# Patient Record
Sex: Male | Born: 1985 | Race: White | Hispanic: No | Marital: Single | Smoking: Never smoker
Health system: Southern US, Community
[De-identification: ages and names within clinical notes are randomized; demographics above are authoritative.]

## PROBLEM LIST (undated history)

## (undated) HISTORY — PX: NO PAST SURGERIES: SHX2092

---

## 2014-09-21 ENCOUNTER — Ambulatory Visit: Payer: Self-pay | Admitting: Family Medicine

## 2016-04-25 ENCOUNTER — Encounter: Payer: Self-pay | Admitting: Family Medicine

## 2016-04-25 ENCOUNTER — Ambulatory Visit (INDEPENDENT_AMBULATORY_CARE_PROVIDER_SITE_OTHER): Payer: BLUE CROSS/BLUE SHIELD | Admitting: Family Medicine

## 2016-04-25 VITALS — BP 120/80 | HR 82 | Temp 98.5°F | Resp 16 | Wt 148.0 lb

## 2016-04-25 DIAGNOSIS — A09 Infectious gastroenteritis and colitis, unspecified: Secondary | ICD-10-CM | POA: Insufficient documentation

## 2016-04-25 DIAGNOSIS — Z8619 Personal history of other infectious and parasitic diseases: Secondary | ICD-10-CM | POA: Insufficient documentation

## 2016-04-25 DIAGNOSIS — R109 Unspecified abdominal pain: Secondary | ICD-10-CM | POA: Insufficient documentation

## 2016-04-25 DIAGNOSIS — H9190 Unspecified hearing loss, unspecified ear: Secondary | ICD-10-CM | POA: Insufficient documentation

## 2016-04-25 DIAGNOSIS — Z789 Other specified health status: Secondary | ICD-10-CM | POA: Insufficient documentation

## 2016-04-25 DIAGNOSIS — M25539 Pain in unspecified wrist: Secondary | ICD-10-CM | POA: Insufficient documentation

## 2016-04-25 MED ORDER — CIPROFLOXACIN HCL 500 MG PO TABS
500.0000 mg | ORAL_TABLET | Freq: Two times a day (BID) | ORAL | Status: DC
Start: 2016-04-25 — End: 2016-10-25

## 2016-04-25 NOTE — Progress Notes (Signed)
       Patient: Marc Gomez Male    DOB: 06/02/1986   30 y.o.   MRN: [id number] Visit Date: 04/25/2016  Today's Provider: Megan Mansichard Gilbert Jr, MD   Chief Complaint  Patient presents with  . Diarrhea   Subjective:    Diarrhea  This is a new problem. The current episode started in the past 7 days (1 week ago). The problem occurs 5 to 10 times per day (4-5 times a day). The stool consistency is described as watery. The patient states that diarrhea does not awaken him from sleep. Associated symptoms include abdominal pain and increased flatus. Pertinent negatives include no arthralgias, bloating, chills, coughing, fever, headaches, myalgias, sweats, URI, vomiting or weight loss. Nothing aggravates the symptoms. Risk factors: just got back from [place] 1 week ago. He has tried nothing for the symptoms. There is no history of bowel resection, inflammatory bowel disease, irritable bowel syndrome, malabsorption, a recent abdominal surgery or short gut syndrome.     Patient just got back from a trip from [place] 1 week ago. He was there for 9 days. Diarrhea started the day he returned home. Bowel movements occur 4-5 times a day. Diarrhea was worse the first few days but has improved some since but not better. Does have some symptoms of abdominal pain. No fever.   No Known Allergies No outpatient prescriptions have been marked as taking for the 04/25/16 encounter (Office Visit) with Maple Hudsonichard L Gilbert Jr., MD.    Review of Systems  Constitutional: Negative for fever, chills, weight loss and appetite change.  Respiratory: Negative for cough, chest tightness, shortness of breath and wheezing.   Cardiovascular: Negative for chest pain and palpitations.  Gastrointestinal: Positive for abdominal pain, diarrhea and flatus. Negative for nausea, vomiting and bloating.  Musculoskeletal: Negative for myalgias and arthralgias.  Neurological: Negative for headaches.    Social History  Substance Use  Topics  . Smoking status: Never Smoker   . Smokeless tobacco: Not on file  . Alcohol Use: 0.0 oz/week    0 Standard drinks or equivalent per week   Objective:   BP 120/80 mmHg  Pulse 82  Temp(Src) 98.5 F (36.9 C) (Oral)  Resp 16  Wt 148 lb (67.132 kg)  SpO2 99%  Physical Exam  Constitutional: He is oriented to person, place, and time. He appears well-developed and well-nourished.  HENT:  Head: Normocephalic and atraumatic.  Eyes: Conjunctivae are normal. No scleral icterus.  Neck: Neck supple.  Cardiovascular: Normal rate, regular rhythm and normal heart sounds.   Pulmonary/Chest: Effort normal and breath sounds normal.  Abdominal: Soft. There is no tenderness.  Neurological: He is alert and oriented to person, place, and time.  Skin: Skin is warm and dry.  Psychiatric: He has a normal mood and affect. His behavior is normal. Judgment and thought content normal.        Assessment & Plan:     Traveler's Diarrhea: Ciprofloxacin 500 mg BID #14      I have done the exam and reviewed the above chart and it is accurate to the best of my knowledge.  Richard Wendelyn BreslowGilbert Jr, MD  [facility]

## 2016-04-25 NOTE — Patient Instructions (Signed)
Traveler's Diarrhea: Ciprofloxacin 500 mg BID #14

## 2016-09-27 ENCOUNTER — Ambulatory Visit: Payer: BLUE CROSS/BLUE SHIELD | Admitting: Family Medicine

## 2016-10-15 ENCOUNTER — Ambulatory Visit: Payer: Self-pay | Admitting: Family Medicine

## 2016-10-24 ENCOUNTER — Ambulatory Visit: Payer: BLUE CROSS/BLUE SHIELD | Admitting: Physician Assistant

## 2016-10-24 ENCOUNTER — Telehealth: Payer: Self-pay

## 2016-10-24 NOTE — Telephone Encounter (Signed)
Per ana patient called back and canceled appointment, pt reports he vomited and is feeling better now and is able to swallow. sd

## 2016-10-24 NOTE — Telephone Encounter (Signed)
Can he drink liquids at all? Is he tolerating his on spit? If not, or if he develops breathing difficulties he needs to go to ER.   I will also send message to [place] since she is who is seeing him.

## 2016-10-24 NOTE — Telephone Encounter (Signed)
Patient reports he is having a hard time swallowing after taking 6 capsules of Juice Plus. Patient reports he has been taking these for several years and has never had this problem. Patient denies shortness of breath, chest pain. Patient has an appointment scheduled at 3:30 today. Please advise. Thank you. sd

## 2016-10-24 NOTE — Telephone Encounter (Signed)
ok 

## 2016-10-25 ENCOUNTER — Encounter: Payer: Self-pay | Admitting: Family Medicine

## 2016-10-25 ENCOUNTER — Ambulatory Visit
Admission: RE | Admit: 2016-10-25 | Discharge: 2016-10-25 | Disposition: A | Discharge status: Home / Self Care | Payer: BLUE CROSS/BLUE SHIELD | Source: Ambulatory Visit | Attending: Family Medicine | Admitting: Family Medicine

## 2016-10-25 ENCOUNTER — Ambulatory Visit (INDEPENDENT_AMBULATORY_CARE_PROVIDER_SITE_OTHER): Payer: BLUE CROSS/BLUE SHIELD | Admitting: Family Medicine

## 2016-10-25 ENCOUNTER — Telehealth: Payer: Self-pay

## 2016-10-25 VITALS — BP 118/60 | HR 70 | Temp 98.2°F | Resp 16 | Wt 151.2 lb

## 2016-10-25 DIAGNOSIS — M25572 Pain in left ankle and joints of left foot: Secondary | ICD-10-CM | POA: Diagnosis not present

## 2016-10-25 DIAGNOSIS — M7989 Other specified soft tissue disorders: Secondary | ICD-10-CM | POA: Diagnosis not present

## 2016-10-25 NOTE — Telephone Encounter (Signed)
Patient advised as below. Patient reports he prefers to wait on the podiatry referral. sd

## 2016-10-25 NOTE — Progress Notes (Signed)
Subjective:     Patient ID: Marc Gomez, male   DOB: 05/05/1986, 30 y.o.   MRN: [id number]  HPI  Chief Complaint  Patient presents with  . Foot Pain    Patient comes in office today with complaints of left foot pain after injury on 09/06/16. Patient states that he was hiking when he jumped and landed on rock, patient states the following day after injury he had swelling and discoloration around foot.   Reports residual ankle pain on the medial side when turning his foot a certain way or with w.b.   Review of Systems     Objective:   Physical Exam  Constitutional: He appears well-developed and well-nourished. No distress.  Cardiovascular:  Pulses:      Dorsalis pedis pulses are 2+ on the left side.       Posterior tibial pulses are 2+ on the left side.  Musculoskeletal:  Localizes tenderness to his medial ankle proximal to his malleolus. Ankle ligaments stable with no increased pain on testing. DF/PF 5/5.       Assessment:    1. Acute left ankle pain - DG Ankle Complete Left; Future    Plan:    Further f/u pending x-ray report. Consider podiatry referral.

## 2016-10-25 NOTE — Patient Instructions (Signed)
We will call you with the x-ray report 

## 2016-10-25 NOTE — Telephone Encounter (Signed)
-----   Message from Anola Gurneyobert Chauvin, [place] sent at 10/25/2016  4:57 PM EST ----- No fractures or other bony abnormality. Let me know if you wish podiatry referral.

## 2018-01-22 IMAGING — CR DG ANKLE COMPLETE 3+V*L*
1 series · 3 of 3 positions shown · non-contrast
Comparison: None.

CLINICAL DATA: Pt states he was hiking 6 weeks ago and jumped [place] [address]
[place] twisting his left ankle. Pain and swelling medial left ankle.
Painful to bear weight.

EXAM:
LEFT ANKLE COMPLETE - 3+ VIEW

[Series 1: dg ankle complete left · 0.14mm/px · 3 of 3 slices shown]
[im 1/3]
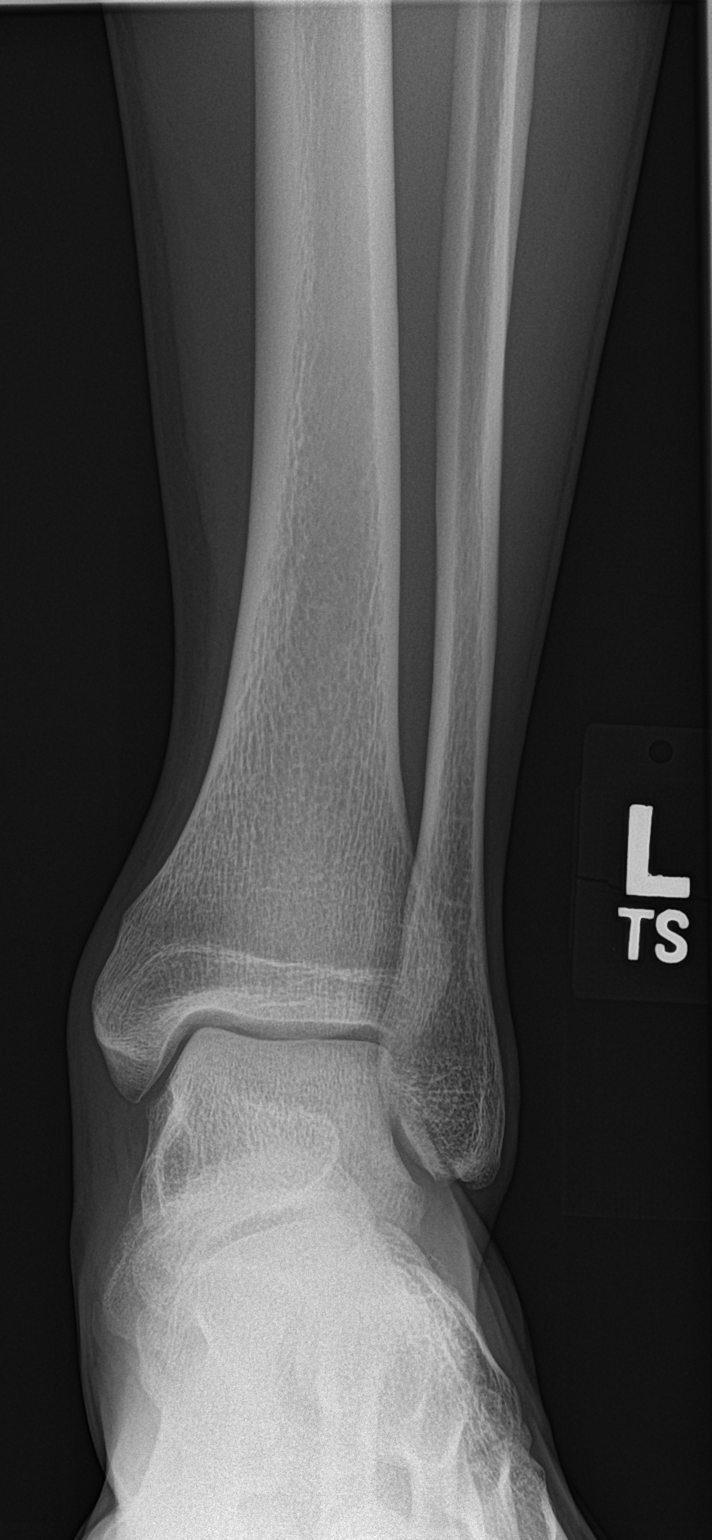
[im 2/3]
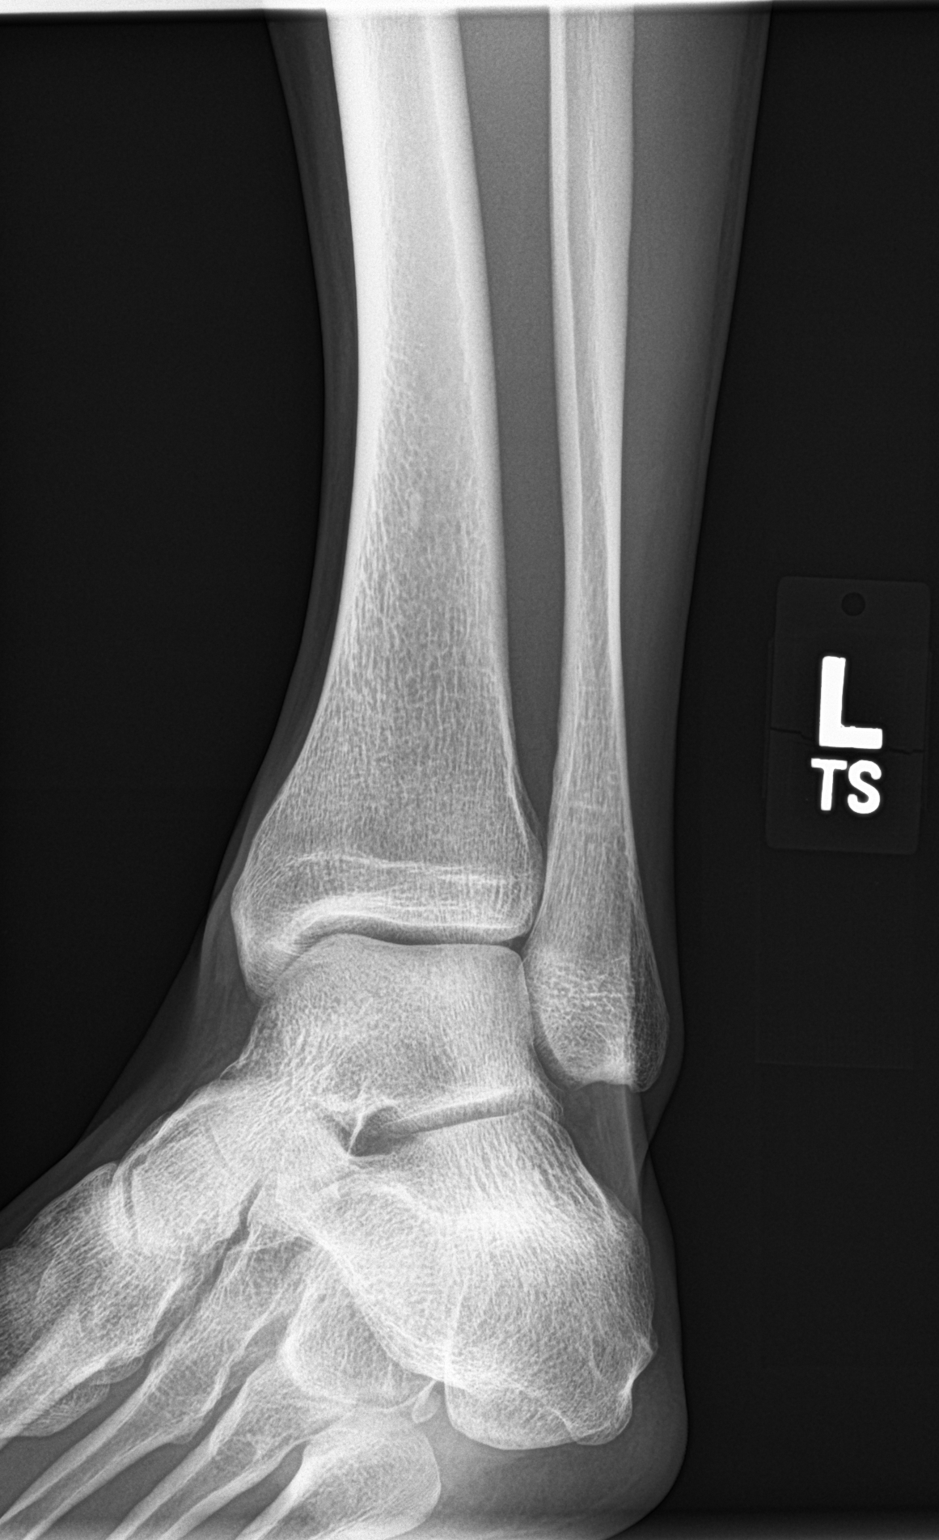
[im 3/3]
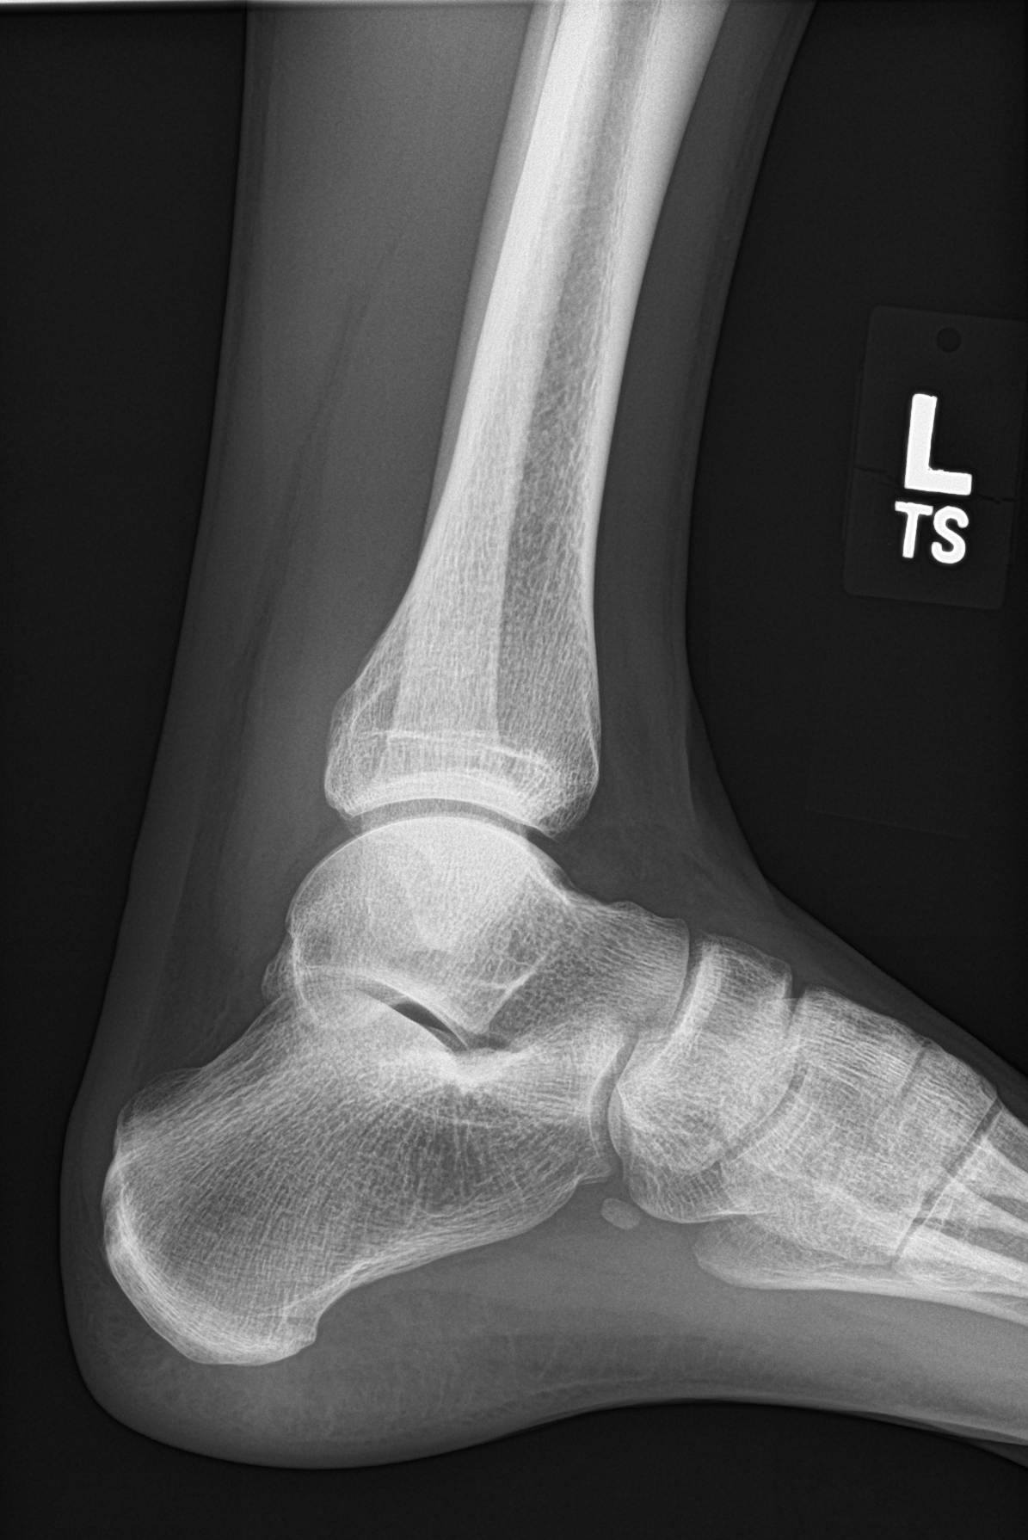

[3 of 3 positions shown; findings below may reference images not displayed]

FINDINGS: There is no evidence of fracture, dislocation, or joint effusion.
There is no evidence of arthropathy or other focal bone abnormality.
Soft tissues are unremarkable.
IMPRESSION: Negative.

## 2018-03-03 ENCOUNTER — Encounter: Payer: Self-pay | Admitting: Family Medicine

## 2018-03-03 ENCOUNTER — Ambulatory Visit (INDEPENDENT_AMBULATORY_CARE_PROVIDER_SITE_OTHER): Payer: BLUE CROSS/BLUE SHIELD | Admitting: Family Medicine

## 2018-03-03 VITALS — BP 110/80 | HR 66 | Temp 98.6°F | Resp 16 | Wt 153.0 lb

## 2018-03-03 DIAGNOSIS — R1010 Upper abdominal pain, unspecified: Secondary | ICD-10-CM

## 2018-03-03 NOTE — Patient Instructions (Signed)
We will call you with the lab results and the scheduling of the ultrasound.

## 2018-03-03 NOTE — Progress Notes (Signed)
  Subjective:     Patient ID: Marc Gomez, male   DOB: [birth year], 32 y.o.   MRN: [id number] Chief Complaint  Patient presents with  . Abdominal Pain    Patient comes in office today with complaints of intermittent upper abdominal pain for one month. Patient describes pain as pressure, he states he has relief when bending over and is usually aggravated by sitting up and eating. Patient denies G.I upset, fever or symptoms of acid reflux.    HPI States it has bothered him significantly three times over the last year. Reports both cramping and pressure which can seem to worsen after a meal. No change in bowel pattern-2 x day- or unusual stress. Rarely uses an antacid.  Review of Systems     Objective:   Physical Exam  Constitutional: He appears well-developed and well-nourished. No distress.  Abdominal: Bowel sounds are normal. There is no tenderness. There is no guarding.  .     Assessment:    1. Pain of upper abdomen - Comprehensive metabolic panel - CBC with Differential/Platelet - Lipase - H. pylori breath test - US Abdomen Complete; Future    Plan:    Further f/u pending lab and ultrasound results.

## 2018-03-04 ENCOUNTER — Telehealth: Payer: Self-pay

## 2018-03-04 LAB — COMPREHENSIVE METABOLIC PANEL
ALT: 37 IU/L (ref 0–44)
AST: 32 IU/L (ref 0–40)
Albumin/Globulin Ratio: 2.1 (ref 1.2–2.2)
Albumin: 4.7 g/dL (ref 3.5–5.5)
Alkaline Phosphatase: 80 IU/L (ref 39–117)
BUN/Creatinine Ratio: 13 (ref 9–20)
BUN: 12 mg/dL (ref 6–20)
Bilirubin Total: 0.5 mg/dL (ref 0.0–1.2)
CO2: 26 mmol/L (ref 20–29)
Calcium: 9.8 mg/dL (ref 8.7–10.2)
Chloride: 100 mmol/L (ref 96–106)
Creatinine, Ser: 0.93 mg/dL (ref 0.76–1.27)
GFR calc Af Amer: 126 mL/min/{1.73_m2} (ref >59)
GFR calc non Af Amer: 109 mL/min/{1.73_m2} (ref >59)
Globulin, Total: 2.2 g/dL (ref 1.5–4.5)
Glucose: 87 mg/dL (ref 65–99)
Potassium: 4.7 mmol/L (ref 3.5–5.2)
Sodium: 140 mmol/L (ref 134–144)
Total Protein: 6.9 g/dL (ref 6.0–8.5)

## 2018-03-04 LAB — CBC WITH DIFFERENTIAL/PLATELET
Basophils Absolute: 0 10*3/uL (ref 0.0–0.2)
Basos: 0 %
EOS (ABSOLUTE): 0.4 10*3/uL (ref 0.0–0.4)
Eos: 8 %
Hematocrit: 46.5 % (ref 37.5–51.0)
Hemoglobin: 15.8 g/dL (ref 13.0–17.7)
Immature Grans (Abs): 0 10*3/uL (ref 0.0–0.1)
Immature Granulocytes: 0 %
Lymphocytes Absolute: 1.3 10*3/uL (ref 0.7–3.1)
Lymphs: 26 %
MCH: 30.3 pg (ref 26.6–33.0)
MCHC: 34 g/dL (ref 31.5–35.7)
MCV: 89 fL (ref 79–97)
Monocytes Absolute: 0.5 10*3/uL (ref 0.1–0.9)
Monocytes: 9 %
Neutrophils Absolute: 2.9 10*3/uL (ref 1.4–7.0)
Neutrophils: 57 %
Platelets: 302 10*3/uL (ref 150–379)
RBC: 5.22 x10E6/uL (ref 4.14–5.80)
RDW: 13 % (ref 12.3–15.4)
WBC: 5.1 10*3/uL (ref 3.4–10.8)

## 2018-03-04 LAB — H. PYLORI BREATH TEST: H pylori Breath Test: NEGATIVE

## 2018-03-04 LAB — LIPASE: Lipase: 19 U/L (ref 13–78)

## 2018-03-04 NOTE — Telephone Encounter (Signed)
-----   Message from Anola Gurney, [place] sent at 03/04/2018  7:26 AM EDT ----- Labs are normal pending breath test and ultrasound results.

## 2018-03-04 NOTE — Telephone Encounter (Signed)
Patient has been advised. KW 

## 2018-03-05 ENCOUNTER — Telehealth: Payer: Self-pay

## 2018-03-05 NOTE — Telephone Encounter (Signed)
-----   Message from Anola Gurney, [place] sent at 03/04/2018  4:58 PM EDT ----- Breath test negative for ulcers.

## 2018-03-05 NOTE — Telephone Encounter (Signed)
Patient advised.KW 

## 2018-03-07 ENCOUNTER — Ambulatory Visit
Admission: RE | Admit: 2018-03-07 | Discharge: 2018-03-07 | Disposition: A | Discharge status: Home / Self Care | Payer: BLUE CROSS/BLUE SHIELD | Source: Ambulatory Visit | Attending: Family Medicine | Admitting: Family Medicine

## 2018-03-07 DIAGNOSIS — R1013 Epigastric pain: Secondary | ICD-10-CM | POA: Diagnosis not present

## 2018-03-07 DIAGNOSIS — R1011 Right upper quadrant pain: Secondary | ICD-10-CM | POA: Diagnosis not present

## 2018-03-07 DIAGNOSIS — R1010 Upper abdominal pain, unspecified: Secondary | ICD-10-CM

## 2018-03-10 ENCOUNTER — Telehealth: Payer: Self-pay

## 2018-03-10 NOTE — Telephone Encounter (Signed)
Patient advised.KW 

## 2018-03-10 NOTE — Telephone Encounter (Signed)
-----   Message from Anola Gurney, [place] sent at 03/10/2018  7:37 AM EDT ----- Ultrasound is normal. Try over the counter Prevacid 15 mg-two pills daily for two weeks. Call if not improving for referral.

## 2018-10-16 ENCOUNTER — Other Ambulatory Visit: Payer: Self-pay

## 2018-10-16 ENCOUNTER — Ambulatory Visit (INDEPENDENT_AMBULATORY_CARE_PROVIDER_SITE_OTHER): Payer: BLUE CROSS/BLUE SHIELD | Admitting: Family Medicine

## 2018-10-16 ENCOUNTER — Encounter: Payer: Self-pay | Admitting: Family Medicine

## 2018-10-16 VITALS — BP 130/80 | HR 60 | Temp 97.8°F | Ht 71.0 in | Wt 156.0 lb

## 2018-10-16 DIAGNOSIS — J209 Acute bronchitis, unspecified: Secondary | ICD-10-CM

## 2018-10-16 MED ORDER — AZITHROMYCIN 250 MG PO TABS: ORAL_TABLET | ORAL | 0 refills | Status: DC

## 2018-10-16 NOTE — Progress Notes (Signed)
  Subjective:     Patient ID: Marc Gomez, male   DOB: 11/08/1985, 32 y.o.   MRN: [id number] Chief Complaint  Patient presents with  . chest congestion    since 10/09/18 some cough and has got worse and can hear it rattle   HPI Reports initial scratchy throat and hoarseness but no sinus congestion. Reports cough is worsening with occasional production of purulent sputum and poor exercise tolerance. States he has had pneumonia a few years ago.  Review of Systems     Objective:   Physical Exam Constitutional:      General: He is not in acute distress.    Appearance: He is not ill-appearing.  Neurological:     Mental Status: He is alert.   Ears: T.M's intact without inflammation Throat: no tonsillar enlargement or exudate Neck: no cervical adenopathy Lungs: left basilar crackles and coarse expiratory sounds     Assessment:    1. Acute bronchitis, unspecified organism - azithromycin (ZITHROMAX Z-PAK) 250 MG tablet; Take 2 pills the first day then one pill daily  Dispense: 6 each; Refill: 0    Plan:    Discussed use of Mucinex and Delsym (samples provided).

## 2018-10-16 NOTE — Patient Instructions (Signed)
Discussed use of Delsym for cough and Mucinex for an expectorant.

## 2018-11-17 ENCOUNTER — Other Ambulatory Visit: Payer: Self-pay

## 2018-11-17 ENCOUNTER — Ambulatory Visit (INDEPENDENT_AMBULATORY_CARE_PROVIDER_SITE_OTHER): Payer: BLUE CROSS/BLUE SHIELD | Admitting: Family Medicine

## 2018-11-17 ENCOUNTER — Encounter: Payer: Self-pay | Admitting: Family Medicine

## 2018-11-17 VITALS — BP 136/83 | HR 74 | Temp 98.1°F | Ht 71.0 in | Wt 150.8 lb

## 2018-11-17 DIAGNOSIS — J069 Acute upper respiratory infection, unspecified: Secondary | ICD-10-CM | POA: Diagnosis not present

## 2018-11-17 DIAGNOSIS — J4520 Mild intermittent asthma, uncomplicated: Secondary | ICD-10-CM

## 2018-11-17 MED ORDER — ALBUTEROL SULFATE HFA 108 (90 BASE) MCG/ACT IN AERS: 2.0000 | INHALATION_SPRAY | Freq: Four times a day (QID) | RESPIRATORY_TRACT | 2 refills | Status: DC | PRN

## 2018-11-17 NOTE — Progress Notes (Signed)
  Subjective:     Patient ID: Marc Gomez, male   DOB: 06-04-1986, 33 y.o.   MRN: [id number] Chief Complaint  Patient presents with  . Fever    friday 11/14/18 was 101  . chest congestion    non productive, tightness, some wheezing   HPI States he is using Mucinex DM and a family member's Albuterol inhaler with improvement. Reports mild sore throat, fever to 101, no sinus congestion and non-productive cough. No childhood asthma. Review of Systems     Objective:   Physical Exam Constitutional:      General: He is not in acute distress.    Appearance: He is not ill-appearing.  Neurological:     Mental Status: He is alert.   Ears: T.M's intact without inflammation Sinuses: non-tender Throat: no tonsillar enlargement or exudate Neck: no cervical adenopathy Lungs: clear     Assessment:    1. URI, acute  2. Mild intermittent reactive airway disease without complication: rx for albuterol    Plan:    Continue Mucinex DM and albuterol. Call if not improving over the course of the week.

## 2018-11-17 NOTE — Patient Instructions (Signed)
Continue Mucinex DM and schedule albuterol inhaler at least twice daily while ill.

## 2018-11-28 ENCOUNTER — Other Ambulatory Visit: Payer: Self-pay | Admitting: Family Medicine

## 2018-11-28 ENCOUNTER — Telehealth: Payer: Self-pay

## 2018-11-28 MED ORDER — OSELTAMIVIR PHOSPHATE 75 MG PO CAPS
75.0000 mg | ORAL_CAPSULE | Freq: Two times a day (BID) | ORAL | 0 refills | Status: DC
Start: 2018-11-28 — End: 2022-04-20

## 2018-11-28 NOTE — Telephone Encounter (Signed)
Patient called office wanting to let physician know since last visit he has ran a fever on/off, he states that today his temperature was 101.2 and reports that cough has not improved since he was seen. Patient wanted to know if something can be called in or does he need an appointment?KW

## 2018-11-28 NOTE — Telephone Encounter (Signed)
States he has residual dry cough left over from o.v. 1/20 for URI.Marc Gomez He was feeling ok until the last 24 hours when he developed fever to 101.2 and aches. 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 Will send in Tamiflu and have him phone f/u on 2/3 if new sx.

## 2018-12-02 ENCOUNTER — Telehealth: Payer: Self-pay | Admitting: Family Medicine

## 2018-12-02 NOTE — Telephone Encounter (Signed)
Please review

## 2018-12-02 NOTE — Telephone Encounter (Signed)
Pt called saying he has had a lingering cough and was told to call back if not better  Call back  [phone]  Thanks teri

## 2018-12-02 NOTE — Telephone Encounter (Signed)
States fever is gone, cough is occasionally productive with mild wheezing. He is using albuterol and Mucinex DM. Will stay the course with phone f/u in two days.

## 2018-12-04 NOTE — Telephone Encounter (Signed)
Pt called back to say he has no fever but he states his cough is worse  Please advise  CB# [phone]  Thanks Barth Kirks

## 2018-12-05 ENCOUNTER — Other Ambulatory Visit: Payer: Self-pay | Admitting: Family Medicine

## 2018-12-05 DIAGNOSIS — J209 Acute bronchitis, unspecified: Secondary | ICD-10-CM

## 2018-12-05 MED ORDER — AZITHROMYCIN 250 MG PO TABS
ORAL_TABLET | ORAL | 0 refills | Status: DC
Start: 2018-12-05 — End: 2022-04-20

## 2018-12-05 MED ORDER — PREDNISONE 20 MG PO TABS: ORAL_TABLET | ORAL | 0 refills | Status: DC

## 2018-12-05 NOTE — Telephone Encounter (Signed)
Please review

## 2018-12-05 NOTE — Telephone Encounter (Signed)
Pt returning a missed call. Please call pt back to discuss.  Thanks, [employer]

## 2018-12-05 NOTE — Telephone Encounter (Signed)
Reports increased wheezing, 3 x day albuterol use, and occasional production of purulent sputum. Will treat for post-flu bronchitis with Zithromax and prednisone x 5 days.

## 2020-03-30 IMAGING — US US ABDOMEN COMPLETE
1 series · 14 of 25 positions shown · non-contrast
Comparison: None.

CLINICAL DATA: Recurrent right upper quadrant and epigastric pain
for 1 month.

EXAM:
ABDOMEN ULTRASOUND COMPLETE

[Series 1: us abdomen complete · 0.19mm/px · 14 of 82 slices shown]
[im 1/82]
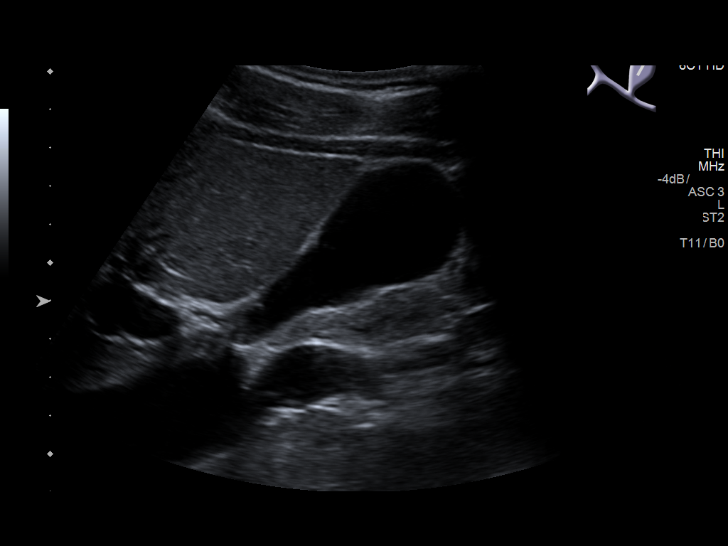
[im 7/82]
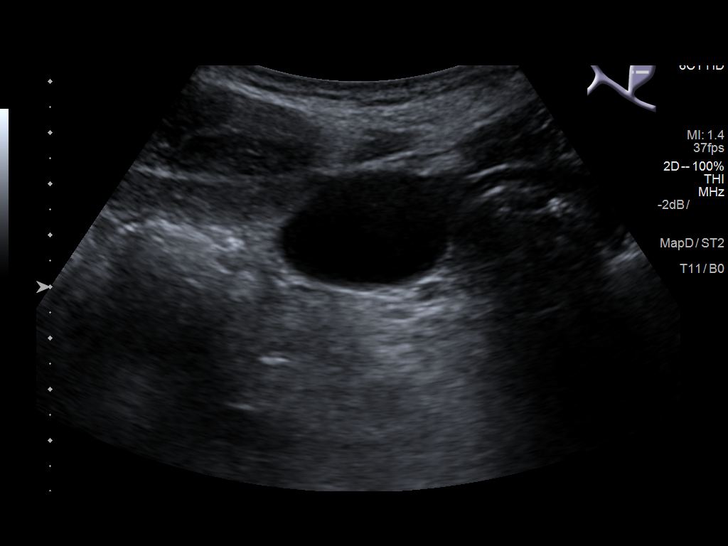
[im 14/82]
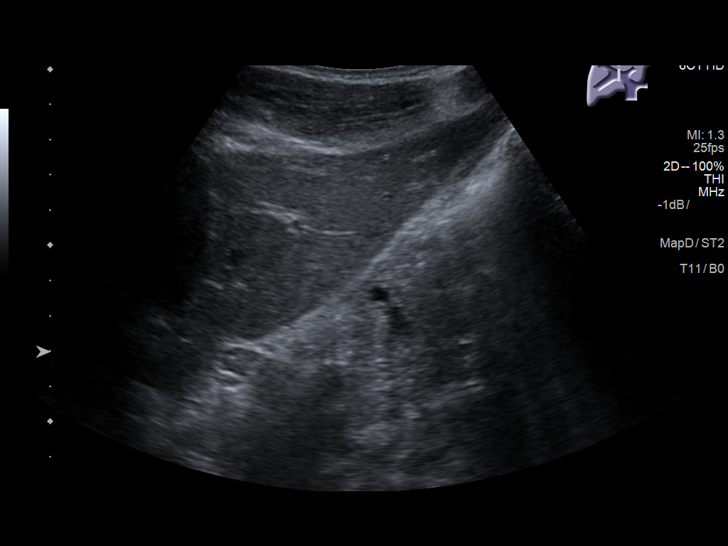
[im 21/82]
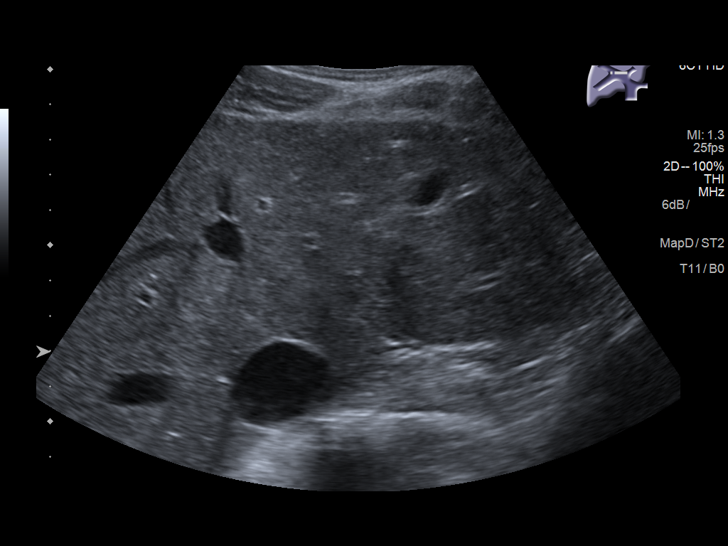
[im 28/82]
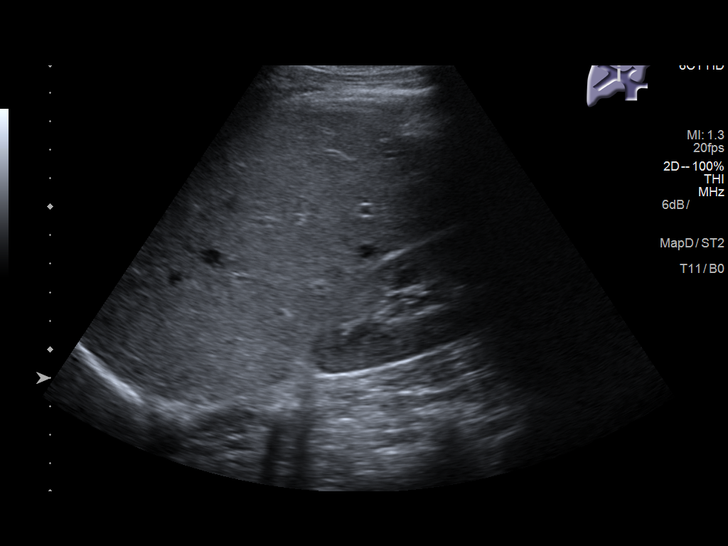
[im 31/82]
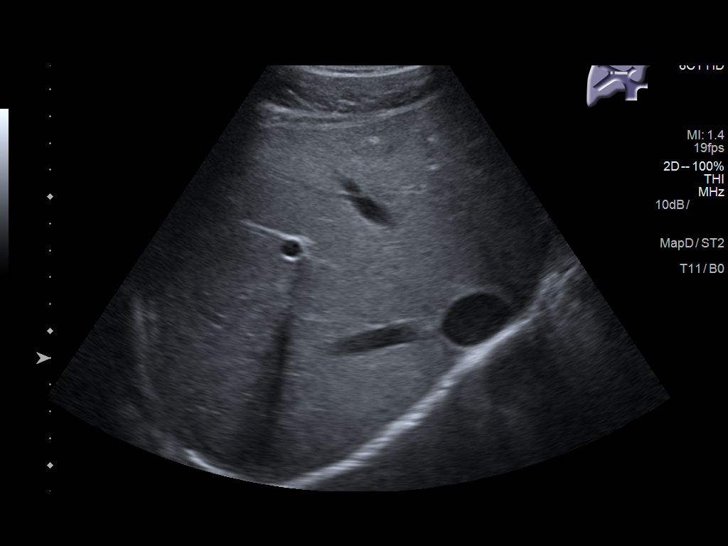
[im 38/82]
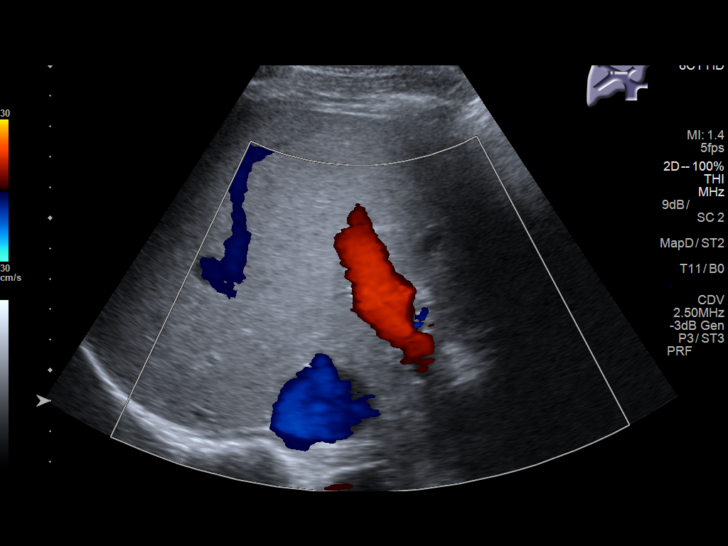
[im 44/82]
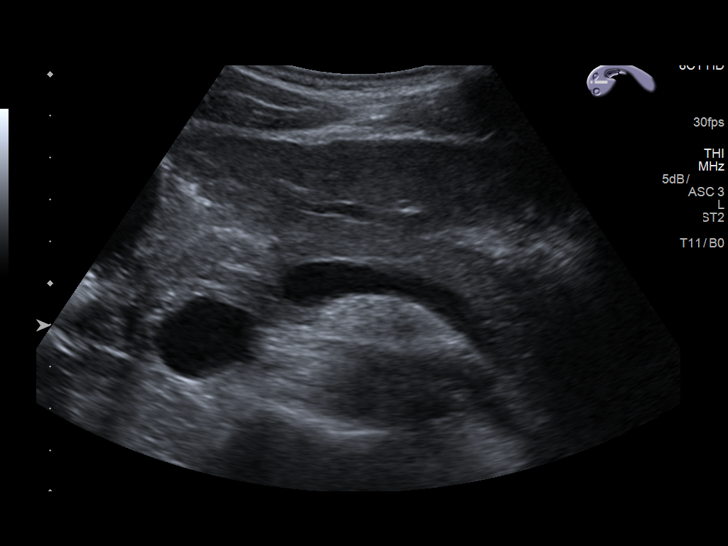
[im 51/82]
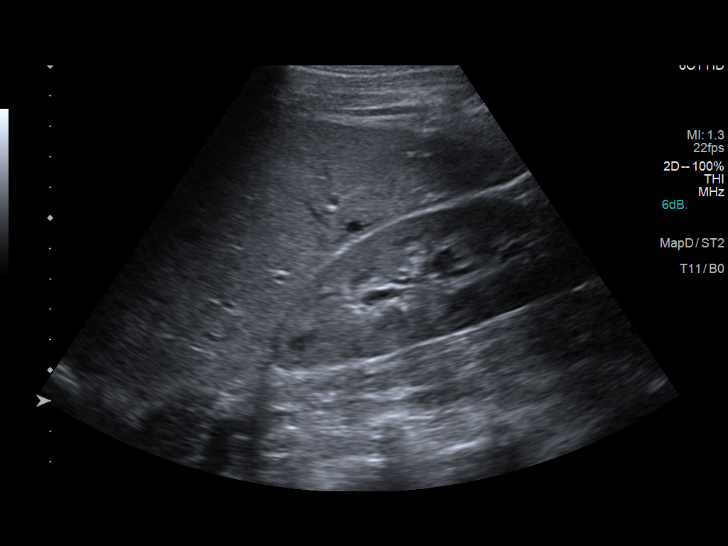
[im 55/82]
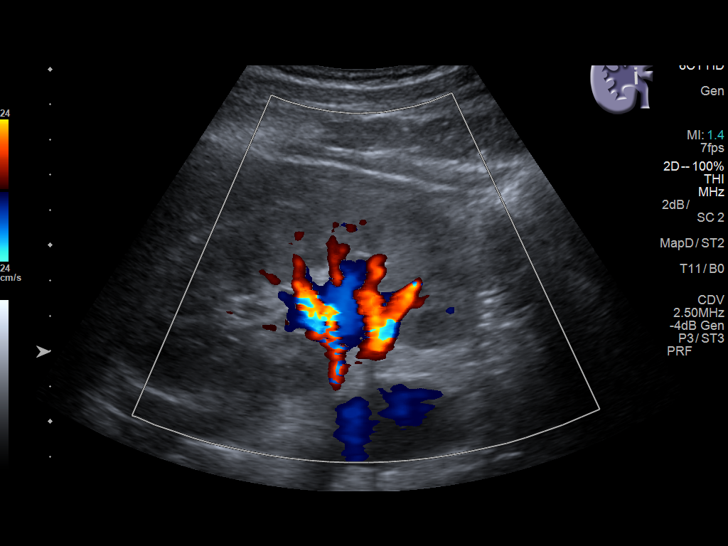
[im 61/82]
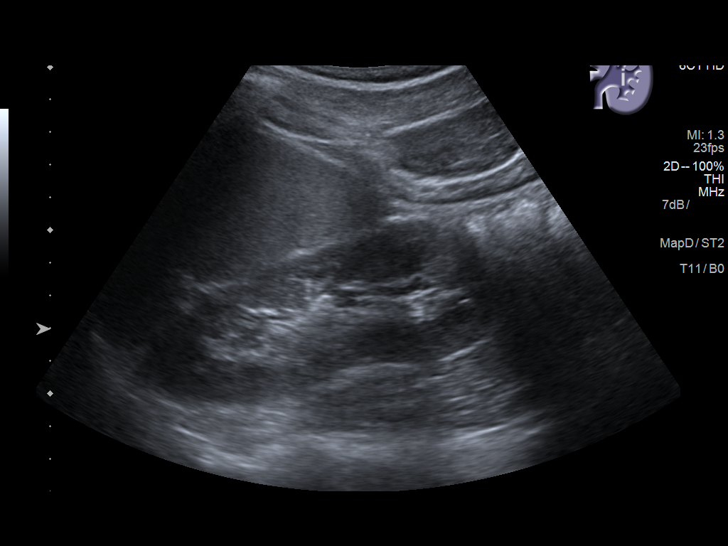
[im 68/82]
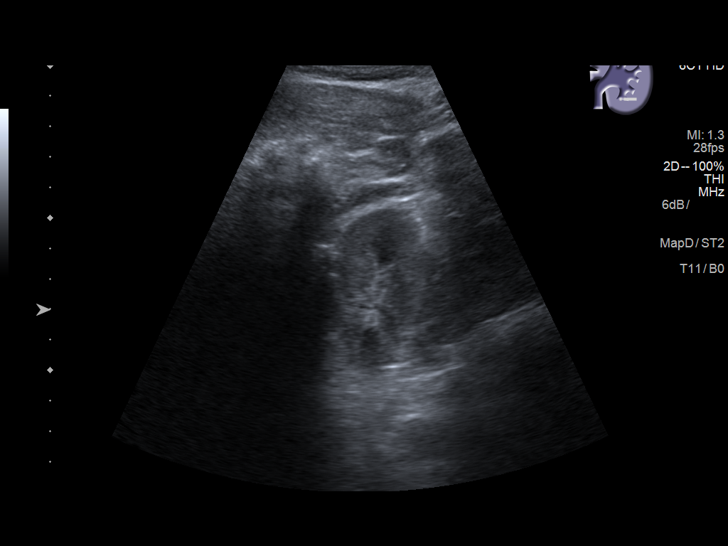
[im 75/82]
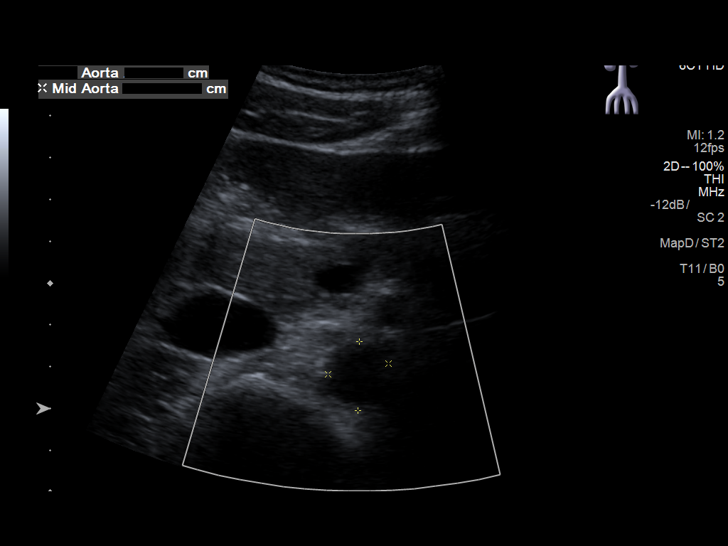
[im 82/82]
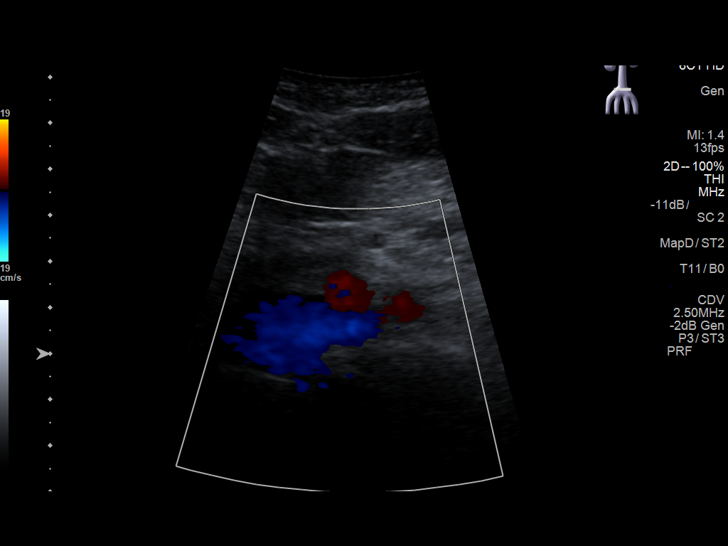

[14 of 25 positions shown; findings below may reference images not displayed]

FINDINGS: Gallbladder: No gallstones or wall thickening visualized. No
sonographic Murphy sign noted by sonographer.

Common bile duct: Diameter: 0.4 cm

Liver: No focal lesion identified. Within normal limits in
parenchymal echogenicity. Portal vein is patent on color Doppler
imaging with normal direction of blood flow towards the liver.

IVC: No abnormality visualized.

Pancreas: Visualized portion unremarkable.

Spleen: Size and appearance within normal limits.

Right Kidney: Length: 11.9 cm. Echogenicity within normal limits. No
mass or hydronephrosis visualized.

Left Kidney: Length: 11.3 cm. Echogenicity within normal limits. No
mass or hydronephrosis visualized.

Abdominal aorta: No aneurysm visualized.

Other findings: None.
IMPRESSION: Normal examination.

## 2020-07-11 NOTE — Progress Notes (Deleted)
     Established patient visit   Patient: Marc Gomez   DOB: Oct 24, 1986   34 y.o. Male  MRN: [id number] Visit Date: 07/12/2020  Today's healthcare provider: Megan Mans, MD   No chief complaint on file.  Subjective    HPI  Patient is here concerning right wrist pain times one week.  {Show patient history (optional):23778::" "}   Medications: Outpatient Medications Prior to Visit  Medication Sig  . albuterol (PROVENTIL HFA;VENTOLIN HFA) 108 (90 Base) MCG/ACT inhaler Inhale 2 puffs into the lungs every 6 (six) hours as needed for wheezing or shortness of breath.  Marland Kitchen azithromycin (ZITHROMAX Z-PAK) 250 MG tablet Take 2 pills the first day then one pill daily  . oseltamivir (TAMIFLU) 75 MG capsule Take 1 capsule (75 mg total) by mouth 2 (two) times daily.  . predniSONE (DELTASONE) 20 MG tablet One pill twice daily for 5 days   No facility-administered medications prior to visit.    Review of Systems  Constitutional: Negative for appetite change, chills and fever.  Respiratory: Negative for chest tightness, shortness of breath and wheezing.   Cardiovascular: Negative for chest pain and palpitations.  Gastrointestinal: Negative for abdominal pain, nausea and vomiting.    {Heme  Chem  Endocrine  Serology  Results Review (optional):23779::" "}  Objective    There were no vitals taken for this visit. {Show previous vital signs (optional):23777::" "}  Physical Exam  ***  No results found for any visits on 07/12/20.  Assessment & Plan     ***  No follow-ups on file.      {provider attestation***:1}   Megan Mans, MD  [facility] [phone] (phone) [phone] (fax)  [facility]

## 2020-07-12 ENCOUNTER — Ambulatory Visit: Payer: BLUE CROSS/BLUE SHIELD | Admitting: Family Medicine

## 2021-03-08 DIAGNOSIS — N451 Epididymitis: Secondary | ICD-10-CM | POA: Diagnosis not present

## 2021-04-17 ENCOUNTER — Ambulatory Visit: Payer: BC Managed Care – PPO | Admitting: Family Medicine

## 2021-05-30 ENCOUNTER — Ambulatory Visit: Payer: Self-pay

## 2021-05-30 ENCOUNTER — Telehealth: Payer: BLUE CROSS/BLUE SHIELD | Admitting: Physician Assistant

## 2021-05-30 DIAGNOSIS — U071 COVID-19: Secondary | ICD-10-CM

## 2021-05-30 MED ORDER — ALBUTEROL SULFATE HFA 108 (90 BASE) MCG/ACT IN AERS: 2.0000 | INHALATION_SPRAY | Freq: Four times a day (QID) | RESPIRATORY_TRACT | 2 refills | Status: AC | PRN

## 2021-05-30 MED ORDER — ALBUTEROL SULFATE HFA 108 (90 BASE) MCG/ACT IN AERS: 2.0000 | INHALATION_SPRAY | Freq: Four times a day (QID) | RESPIRATORY_TRACT | 2 refills | Status: DC | PRN

## 2021-05-30 MED ORDER — BENZONATATE 100 MG PO CAPS: 100.0000 mg | ORAL_CAPSULE | Freq: Three times a day (TID) | ORAL | 0 refills | Status: DC | PRN

## 2021-05-30 NOTE — Telephone Encounter (Signed)
Ok to send in albuterol as requested

## 2021-05-30 NOTE — Progress Notes (Signed)
  E-Visit  for Positive Covid Test Result  We are sorry you are not feeling well. We are here to help!  You have tested positive for COVID-19, meaning that you were infected with the novel coronavirus and could give the virus to others.  It is vitally important that you stay home so you do not spread it to others.      Please continue isolation at home, for at least 10 days since the start of your symptoms and until you have had 24 hours with no fever (without taking a fever reducer) and with improving of symptoms.  If you have no symptoms but tested positive (or all symptoms resolve after 5 days and you have no fever) you can leave your house but continue to wear a mask around others for an additional 5 days. If you have a fever,continue to stay home until you have had 24 hours of no fever. Most cases improve 5-10 days from onset but we have seen a small number of patients who have gotten worse after the 10 days.  Please be sure to watch for worsening symptoms and remain taking the proper precautions.   Go to the nearest hospital ED for assessment if fever/cough/breathlessness are severe or illness seems like a threat to life.    The following symptoms may appear 2-14 days after exposure: Fever Cough Shortness of breath or difficulty breathing Chills Repeated shaking with chills Muscle pain Headache Sore throat New loss of taste or smell Fatigue Congestion or runny nose Nausea or vomiting Diarrhea  You have been enrolled in MyChart Home Monitoring for COVID-19. Daily you will receive a questionnaire within the MyChart website. Our COVID-19 response team will be monitoring your responses daily.  You can use medication such as prescription cough medication called Tessalon Perles 100 mg. You may take 1-2 capsules every 8 hours as needed for cough and  prescription inhaler called Albuterol MDI 90 mcg /actuation 2 puffs every 4 hours as needed for shortness of breath, wheezing,  cough  You may also take acetaminophen (Tylenol) as needed for fever.  HOME CARE: Only take medications as instructed by your medical team. Drink plenty of fluids and get plenty of rest. A steam or ultrasonic humidifier can help if you have congestion.   GET HELP RIGHT AWAY IF YOU HAVE EMERGENCY WARNING SIGNS.  Call 911 or proceed to your closest emergency facility if: You develop worsening high fever. Trouble breathing Bluish lips or face Persistent pain or pressure in the chest New confusion Inability to wake or stay awake You cough up blood. Your symptoms become more severe Inability to hold down food or fluids  This list is not all possible symptoms. Contact your medical provider for any symptoms that are severe or concerning to you.    Your e-visit answers were reviewed by a board certified advanced clinical practitioner to complete your personal care plan.  Depending on the condition, your plan could have included both over the counter or prescription medications.  If there is a problem please reply once you have received a response from your provider.  Your safety is important to us.  If you have drug allergies check your prescription carefully.    You can use MyChart to ask questions about today's visit, request a non-urgent call back, or ask for a work or school excuse for 24 hours related to this e-Visit. If it has been greater than 24 hours you will need to follow up with your provider,   or enter a new e-Visit to address those concerns. You will get an e-mail in the next two days asking about your experience.  I hope that your e-visit has been valuable and will speed your recovery. Thank you for using e-visits.      

## 2021-05-30 NOTE — Addendum Note (Signed)
Addended by: Margaretann Loveless on: 05/30/2021 01:23 PM   Modules accepted: Orders

## 2021-05-30 NOTE — Telephone Encounter (Signed)
Please review for Dr. Gilbert  Thanks,   -Kaina Orengo  

## 2021-05-30 NOTE — Telephone Encounter (Signed)
Patient has already received Rx for inhaler.

## 2021-05-30 NOTE — Progress Notes (Signed)
I have spent 5 minutes in review of e-visit questionnaire, review and updating patient chart, medical decision making and response to patient.   Lanea Vankirk Cody Arnav Cregg, PA-C    

## 2021-05-30 NOTE — Telephone Encounter (Signed)
Patient called in to say that he tested positive for Covid have the cough, fever and other symptoms. Cough getting worst so need some directions please call Ph#  [phone]    Pt. Reports had exposure to COVID 19 and has tested positive. Has cough and sore throat. Asking for an inhaler - has had Albuterol in the past. No virtual visits available. Will send triage for review per [facility]. Pt. May try a My Chart visit. Answer Assessment - Initial Assessment Questions 1. COVID-19 DIAGNOSIS: "Who made your COVID-19 diagnosis?" "Was it confirmed by a positive lab test or self-test?" If not diagnosed by a doctor (or NP/PA), ask "Are there lots of cases (community spread) where you live?" Note: See public health department website, if unsure.     Home test 2. COVID-19 EXPOSURE: "Was there any known exposure to COVID before the symptoms began?" CDC Definition of close contact: within 6 feet (2 meters) for a total of 15 minutes or more over a 24-hour period.      Yes 3. ONSET: "When did the COVID-19 symptoms start?"      Sunday 4. WORST SYMPTOM: "What is your worst symptom?" (e.g., cough, fever, shortness of breath, muscle aches)     Worse 5. COUGH: "Do you have a cough?" If Yes, ask: "How bad is the cough?"       Yes 6. FEVER: "Do you have a fever?" If Yes, ask: "What is your temperature, how was it measured, and when did it start?"     Yes 7. RESPIRATORY STATUS: "Describe your breathing?" (e.g., shortness of breath, wheezing, unable to speak)      Chest feels tight 8. BETTER-SAME-WORSE: "Are you getting better, staying the same or getting worse compared to yesterday?"  If getting worse, ask, "In what way?"     Worse 9. HIGH RISK DISEASE: "Do you have any chronic medical problems?" (e.g., asthma, heart or lung disease, weak immune system, obesity, etc.)     No 10. VACCINE: "Have you had the COVID-19 vaccine?" If Yes, ask: "Which one, how many shots, when did you get it?"       Yes 11. BOOSTER:  "Have you received your COVID-19 booster?" If Yes, ask: "Which one and when did you get it?"       X 1 12. PREGNANCY: "Is there any chance you are pregnant?" "When was your last menstrual period?"       N/a 13. OTHER SYMPTOMS: "Do you have any other symptoms?"  (e.g., chills, fatigue, headache, loss of smell or taste, muscle pain, sore throat)       Sore throat 14. O2 SATURATION MONITOR:  "Do you use an oxygen saturation monitor (pulse oximeter) at home?" If Yes, ask "What is your reading (oxygen level) today?" "What is your usual oxygen saturation reading?" (e.g., 95%)       No  Protocols used: Coronavirus (COVID-19) Diagnosed or Suspected-A-AH

## 2021-06-20 ENCOUNTER — Encounter: Payer: Self-pay | Admitting: Family Medicine

## 2021-06-20 ENCOUNTER — Ambulatory Visit (INDEPENDENT_AMBULATORY_CARE_PROVIDER_SITE_OTHER): Payer: BC Managed Care – PPO | Admitting: Family Medicine

## 2021-06-20 ENCOUNTER — Other Ambulatory Visit: Payer: Self-pay

## 2021-06-20 VITALS — BP 120/90 | HR 78 | Temp 98.6°F | Resp 16 | Ht 71.0 in | Wt 156.0 lb

## 2021-06-20 DIAGNOSIS — U071 COVID-19: Secondary | ICD-10-CM

## 2021-06-20 DIAGNOSIS — L570 Actinic keratosis: Secondary | ICD-10-CM | POA: Diagnosis not present

## 2021-06-20 NOTE — Progress Notes (Signed)
Established patient visit   Patient: Marc Gomez   DOB: 14-Dec-1985   35 y.o. Male  MRN: [id number] Visit Date: 06/20/2021  Today's healthcare provider: Megan Mans, MD   Chief Complaint  Patient presents with   mole on face    Subjective  -------------------------------------------------------------------------------------------------------------------- HPI  Patient presents today c/o mole on the right side of his forehead. He reports that it has been there for several weeks. I has now changed in color (darker) and it is becoming painful.  No bleeding and no other spots. Patient is getting ready to embark on a 3-year commitment to work for [employer] on the [employer] in [place]  BP Readings from Last 3 Encounters:  06/20/21 120/90  11/17/18 136/83  10/16/18 130/80   Wt Readings from Last 3 Encounters:  06/20/21 156 lb (70.8 kg)  11/17/18 150 lb 12.8 oz (68.4 kg)  10/16/18 156 lb (70.8 kg)         Medications: Outpatient Medications Prior to Visit  Medication Sig   albuterol (VENTOLIN HFA) 108 (90 Base) MCG/ACT inhaler Inhale 2 puffs into the lungs every 6 (six) hours as needed for wheezing or shortness of breath. (Patient not taking: No sig reported)   azithromycin (ZITHROMAX Z-PAK) 250 MG tablet Take 2 pills the first day then one pill daily (Patient not taking: No sig reported)   benzonatate (TESSALON) 100 MG capsule Take 1 capsule (100 mg total) by mouth 3 (three) times daily as needed for cough. (Patient not taking: No sig reported)   oseltamivir (TAMIFLU) 75 MG capsule Take 1 capsule (75 mg total) by mouth 2 (two) times daily. (Patient not taking: No sig reported)   predniSONE (DELTASONE) 20 MG tablet One pill twice daily for 5 days (Patient not taking: No sig reported)   No facility-administered medications prior to visit.    Review of Systems  Constitutional:  Negative for activity change and fatigue.  Skin:  Positive for color change.  Negative for pallor, rash and wound.  Neurological:  Negative for dizziness and headaches.  Psychiatric/Behavioral:  Negative for decreased concentration. The patient is nervous/anxious.        Objective  -------------------------------------------------------------------------------------------------------------------- BP 120/90   Pulse 78   Temp 98.6 F (37 C)   Resp 16   Ht 5\' 11"  (1.803 m)   Wt 156 lb (70.8 kg)   BMI 21.76 kg/m      Physical Exam Vitals reviewed.  Constitutional:      Appearance: Normal appearance.  HENT:     Head: Normocephalic and atraumatic.     Right Ear: External ear normal.     Left Ear: External ear normal.  Eyes:     General: No scleral icterus.    Conjunctiva/sclera: Conjunctivae normal.  Cardiovascular:     Rate and Rhythm: Normal rate and regular rhythm.     Heart sounds: Normal heart sounds.  Pulmonary:     Effort: Pulmonary effort is normal.     Breath sounds: Normal breath sounds.  Skin:    General: Skin is warm and dry.     Comments: There is 1 very small AK just above and lateral to the right eyebrow lateral border.  No other lesions noted.  He has olive complexion  Neurological:     General: No focal deficit present.     Mental Status: He is alert and oriented to person, place, and time.  Psychiatric:        Mood and  Affect: Mood normal.        Behavior: Behavior normal.        Thought Content: Thought content normal.        Judgment: Judgment normal.      No results found for any visits on 06/20/21.  Assessment & Plan  ---------------------------------------------------------------------------------------------------------------------- 1. AK (actinic keratosis) Frozen with cryo pen.  2. COVID-19 Patient had COVID last month after being vaccinated. Mild symptoms but he has some ongoing fatigue as a sequelae.   No follow-ups on file.      I, Megan Mans, MD, have reviewed all documentation for this visit. The  documentation on 06/21/21 for the exam, diagnosis, procedures, and orders are all accurate and complete.    Cornesha Radziewicz Wendelyn Breslow, MD  [facility] [phone] (phone) [phone] (fax)  [facility]

## 2022-04-19 NOTE — Progress Notes (Unsigned)
      Established patient visit   Patient: Marc Gomez   DOB: 16-Jun-1986   36 y.o. Male  MRN: [id number] Visit Date: 04/20/2022  Today's healthcare provider: Shirlee Latch, MD   No chief complaint on file.  Subjective    Sore Throat    ***  Medications: Outpatient Medications Prior to Visit  Medication Sig  . albuterol (VENTOLIN HFA) 108 (90 Base) MCG/ACT inhaler Inhale 2 puffs into the lungs every 6 (six) hours as needed for wheezing or shortness of breath. (Patient not taking: No sig reported)  . azithromycin (ZITHROMAX Z-PAK) 250 MG tablet Take 2 pills the first day then one pill daily (Patient not taking: No sig reported)  . benzonatate (TESSALON) 100 MG capsule Take 1 capsule (100 mg total) by mouth 3 (three) times daily as needed for cough. (Patient not taking: No sig reported)  . oseltamivir (TAMIFLU) 75 MG capsule Take 1 capsule (75 mg total) by mouth 2 (two) times daily. (Patient not taking: No sig reported)  . predniSONE (DELTASONE) 20 MG tablet One pill twice daily for 5 days (Patient not taking: No sig reported)   No facility-administered medications prior to visit.    Review of Systems  {Labs  Heme  Chem  Endocrine  Serology  Results Review (optional):23779}   Objective    There were no vitals taken for this visit. {Show previous vital signs (optional):23777}  Physical Exam  ***  No results found for any visits on 04/20/22.  Assessment & Plan     ***  No follow-ups on file.      {provider attestation***:1}   Shirlee Latch, MD  [facility] [phone] (phone) [phone] (fax)  [facility]

## 2022-04-20 ENCOUNTER — Ambulatory Visit: Payer: BC Managed Care – PPO | Admitting: Family Medicine

## 2022-04-20 ENCOUNTER — Ambulatory Visit (INDEPENDENT_AMBULATORY_CARE_PROVIDER_SITE_OTHER): Payer: Commercial Managed Care - PPO | Admitting: Family Medicine

## 2022-04-20 ENCOUNTER — Encounter: Payer: Self-pay | Admitting: Family Medicine

## 2022-04-20 VITALS — BP 130/87 | HR 60 | Temp 98.0°F | Resp 16 | Wt 156.3 lb

## 2022-04-20 DIAGNOSIS — J029 Acute pharyngitis, unspecified: Secondary | ICD-10-CM | POA: Diagnosis not present

## 2022-04-20 DIAGNOSIS — K219 Gastro-esophageal reflux disease without esophagitis: Secondary | ICD-10-CM | POA: Diagnosis not present

## 2022-04-20 MED ORDER — OMEPRAZOLE 20 MG PO CPDR: 20.0000 mg | DELAYED_RELEASE_CAPSULE | Freq: Every day | ORAL | 3 refills | Status: AC
